# Patient Record
Sex: Female | Born: 1950 | Race: White | Hispanic: No | Marital: Married | Smoking: Never smoker
Health system: Southern US, Community
[De-identification: ages and names within clinical notes are randomized; demographics above are authoritative.]

## PROBLEM LIST (undated history)

## (undated) DIAGNOSIS — F329 Major depressive disorder, single episode, unspecified: Secondary | ICD-10-CM

## (undated) DIAGNOSIS — F32A Depression, unspecified: Secondary | ICD-10-CM

## (undated) DIAGNOSIS — F419 Anxiety disorder, unspecified: Secondary | ICD-10-CM

## (undated) DIAGNOSIS — I10 Essential (primary) hypertension: Secondary | ICD-10-CM

## (undated) DIAGNOSIS — C50919 Malignant neoplasm of unspecified site of unspecified female breast: Secondary | ICD-10-CM

## (undated) HISTORY — PX: APPENDECTOMY: SHX54

## (undated) HISTORY — PX: OOPHORECTOMY: SHX86

## (undated) HISTORY — PX: MASTECTOMY: SHX3

## (undated) HISTORY — PX: BREAST SURGERY: SHX581

---

## 2005-09-16 ENCOUNTER — Ambulatory Visit: Payer: Self-pay | Admitting: Oncology

## 2016-03-09 ENCOUNTER — Emergency Department (HOSPITAL_BASED_OUTPATIENT_CLINIC_OR_DEPARTMENT_OTHER)
Admission: EM | Admit: 2016-03-09 | Discharge: 2016-03-09 | Disposition: A | Discharge status: Home / Self Care | Payer: BLUE CROSS/BLUE SHIELD | Attending: Emergency Medicine | Admitting: Emergency Medicine

## 2016-03-09 ENCOUNTER — Encounter (HOSPITAL_BASED_OUTPATIENT_CLINIC_OR_DEPARTMENT_OTHER): Payer: Self-pay | Admitting: *Deleted

## 2016-03-09 ENCOUNTER — Emergency Department (HOSPITAL_BASED_OUTPATIENT_CLINIC_OR_DEPARTMENT_OTHER): Payer: BLUE CROSS/BLUE SHIELD

## 2016-03-09 DIAGNOSIS — S93401A Sprain of unspecified ligament of right ankle, initial encounter: Secondary | ICD-10-CM

## 2016-03-09 DIAGNOSIS — Z79899 Other long term (current) drug therapy: Secondary | ICD-10-CM | POA: Insufficient documentation

## 2016-03-09 DIAGNOSIS — F329 Major depressive disorder, single episode, unspecified: Secondary | ICD-10-CM | POA: Insufficient documentation

## 2016-03-09 DIAGNOSIS — Y939 Activity, unspecified: Secondary | ICD-10-CM | POA: Diagnosis not present

## 2016-03-09 DIAGNOSIS — I10 Essential (primary) hypertension: Secondary | ICD-10-CM | POA: Insufficient documentation

## 2016-03-09 DIAGNOSIS — Y929 Unspecified place or not applicable: Secondary | ICD-10-CM | POA: Diagnosis not present

## 2016-03-09 DIAGNOSIS — Z853 Personal history of malignant neoplasm of breast: Secondary | ICD-10-CM | POA: Diagnosis not present

## 2016-03-09 DIAGNOSIS — W19XXXA Unspecified fall, initial encounter: Secondary | ICD-10-CM | POA: Insufficient documentation

## 2016-03-09 DIAGNOSIS — Y999 Unspecified external cause status: Secondary | ICD-10-CM | POA: Insufficient documentation

## 2016-03-09 DIAGNOSIS — S99911A Unspecified injury of right ankle, initial encounter: Secondary | ICD-10-CM | POA: Diagnosis present

## 2016-03-09 HISTORY — DX: Anxiety disorder, unspecified: F41.9

## 2016-03-09 HISTORY — DX: Malignant neoplasm of unspecified site of unspecified female breast: C50.919

## 2016-03-09 HISTORY — DX: Essential (primary) hypertension: I10

## 2016-03-09 HISTORY — DX: Depression, unspecified: F32.A

## 2016-03-09 HISTORY — DX: Major depressive disorder, single episode, unspecified: F32.9

## 2016-03-09 MED ORDER — HYDROCODONE-ACETAMINOPHEN 5-325 MG PO TABS
1.0000 | ORAL_TABLET | Freq: Once | ORAL | Status: AC
  Administered 2016-03-09: 1 via ORAL
  Filled 2016-03-09: qty 1

## 2016-03-09 MED ORDER — NAPROXEN 500 MG PO TABS: 500.0000 mg | ORAL_TABLET | Freq: Two times a day (BID) | ORAL | 0 refills | Status: DC

## 2016-03-09 NOTE — ED Provider Notes (Signed)
[place] [facility] Provider Note   CSN: [id number] Arrival date & time: 03/09/16  [id number]  First Provider Contact:  First MD Initiated Contact with Patient 03/09/16 0109        History   Chief Complaint Chief Complaint  Patient presents with  . Ankle Injury    HPI Kristy Chambers is a 65 y.o. female.  HPI  This is a 65 year old female who presents with a right ankle injury. Patient reports that she was carrying her grandson early today when she her right ankle and fell. She denies hitting her head or loss of consciousness. She has been ambulatory and "thought I was doing better." However, she states that at approximately 9 PM she had increasing right ankle pain. She took Tylenol and ibuprofen with some relief. Current pain is 7 out of 10. She denies any numbness or tingling. She denies any other injury.  Past Medical History:  Diagnosis Date  . Anxiety   . Breast cancer ([place])   . Depression   . Hypertension     There are no active problems to display for this patient.   Past Surgical History:  Procedure Laterality Date  . APPENDECTOMY    . BREAST SURGERY    . MASTECTOMY    . OOPHORECTOMY      OB History    No data available       Home Medications    Prior to Admission medications   Medication Sig Start Date End Date Taking? Authorizing Provider  ALPRAZolam Duanne Moron) 0.5 MG tablet Take 0.5 mg by mouth at bedtime as needed for anxiety.   Yes Historical Provider, MD  FLUoxetine (PROZAC) 20 MG tablet Take 60 mg by mouth daily.   Yes Historical Provider, MD  metoprolol (LOPRESSOR) 50 MG tablet Take 50 mg by mouth 2 (two) times daily.   Yes Historical Provider, MD  olmesartan-hydrochlorothiazide (BENICAR HCT) 40-25 MG tablet Take 1 tablet by mouth daily.   Yes Historical Provider, MD  naproxen (NAPROSYN) 500 MG tablet Take 1 tablet (500 mg total) by mouth 2 (two) times daily. 03/09/16   Merryl Hacker, MD    Family History History reviewed. No pertinent family  history.  Social History Social History  Substance Use Topics  . Smoking status: Never Smoker  . Smokeless tobacco: Not on file  . Alcohol use No     Allergies   Codeine; Darvon [propoxyphene]; and Morphine and related   Review of Systems Review of Systems  Musculoskeletal:       Ankle pain  Skin: Negative for wound.  Neurological: Negative for weakness and numbness.  All other systems reviewed and are negative.    Physical Exam Updated Vital Signs BP 166/77 (BP Location: Left Arm)   Pulse (!) 56   Temp 98.1 F (36.7 C)   Resp 16   Ht 5\' 5"  (1.651 m)   Wt 146 lb (66.2 kg)   SpO2 97%   BMI 24.30 kg/m   Physical Exam  Constitutional: She is oriented to person, place, and time. She appears well-developed and well-nourished.  HENT:  Head: Normocephalic and atraumatic.  Cardiovascular: Normal rate, regular rhythm and normal heart sounds.   Pulmonary/Chest: Effort normal. No respiratory distress.  Musculoskeletal:  Focused examination of the right ankle reveals tenderness to palpation over the medial malleolus, no obvious deformities, no significant swelling, 2+ DP pulse, no proximal fibular tenderness  Neurological: She is alert and oriented to person, place, and time.  Skin: Skin is warm and  dry.  Psychiatric: She has a normal mood and affect.  Nursing note and vitals reviewed.    ED Treatments / Results  Labs (all labs ordered are listed, but only abnormal results are displayed) Labs Reviewed - No data to display  EKG  EKG Interpretation None       Radiology Dg Ankle Complete Right  Result Date: 03/09/2016 CLINICAL DATA:  65 year old female with fall and right ankle pain. EXAM: RIGHT ANKLE - COMPLETE 3+ VIEW COMPARISON:  None. FINDINGS: There is no evidence of fracture, dislocation, or joint effusion. There is no evidence of arthropathy or other focal bone abnormality. Soft tissues are unremarkable. IMPRESSION: Negative. Electronically Signed   By:  Anner Crete M.D.   On: 03/09/2016 01:26   Procedures Procedures (including critical care time)  Medications Ordered in ED Medications  HYDROcodone-acetaminophen (NORCO/VICODIN) 5-325 MG per tablet 1 tablet (1 tablet Oral Given 03/09/16 0129)     Initial Impression / Assessment and Plan / ED Course  I have reviewed the triage vital signs and the nursing notes.  Pertinent labs & imaging results that were available during my care of the patient were reviewed by me and considered in my medical decision making (see chart for details).  Clinical Course    Patient presents after injury to the right ankle. Has been ambulatory. Worsening pain. Nontoxic. No significant deformity or swelling. X-rays negative for fracture. Likely sprain. Discussed with patient rest, ice, compression, and elevation. Increasing pain likely related to her being ambulatory most of the day on an acute sprain. Naproxen twice a day for 3-5 days.  After history, exam, and medical workup I feel the patient has been appropriately medically screened and is safe for discharge home. Pertinent diagnoses were discussed with the patient. Patient was given return precautions.   Final Clinical Impressions(s) / ED Diagnoses   Final diagnoses:  Ankle sprain, right, initial encounter    New Prescriptions New Prescriptions   NAPROXEN (NAPROSYN) 500 MG TABLET    Take 1 tablet (500 mg total) by mouth 2 (two) times daily.     Merryl Hacker, MD 03/09/16 [phone]

## 2016-03-09 NOTE — ED Triage Notes (Signed)
Pt c/o fall with right ankle injury x 1 day ago

## 2018-02-25 IMAGING — DX DG ANKLE COMPLETE 3+V*R*
3 series · 3 of 3 positions shown · non-contrast
Comparison: None.

CLINICAL DATA: 65-year-old female with fall and right ankle pain.

EXAM:
RIGHT ANKLE - COMPLETE 3+ VIEW

[ankle ap]
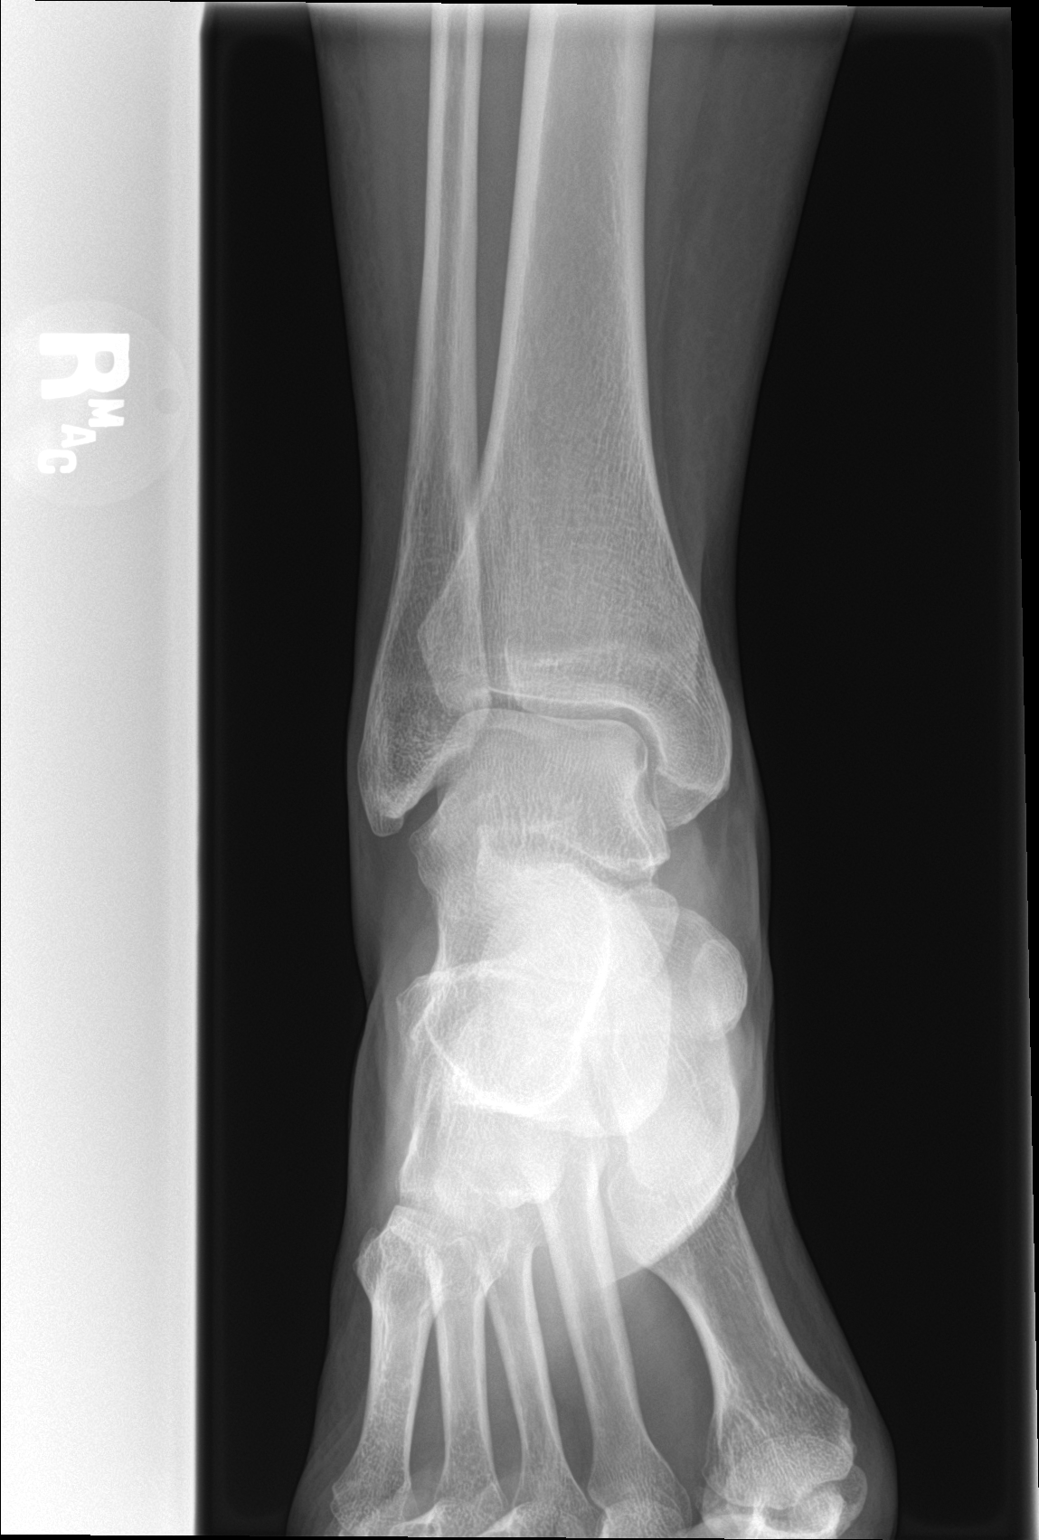

[ankle obl]
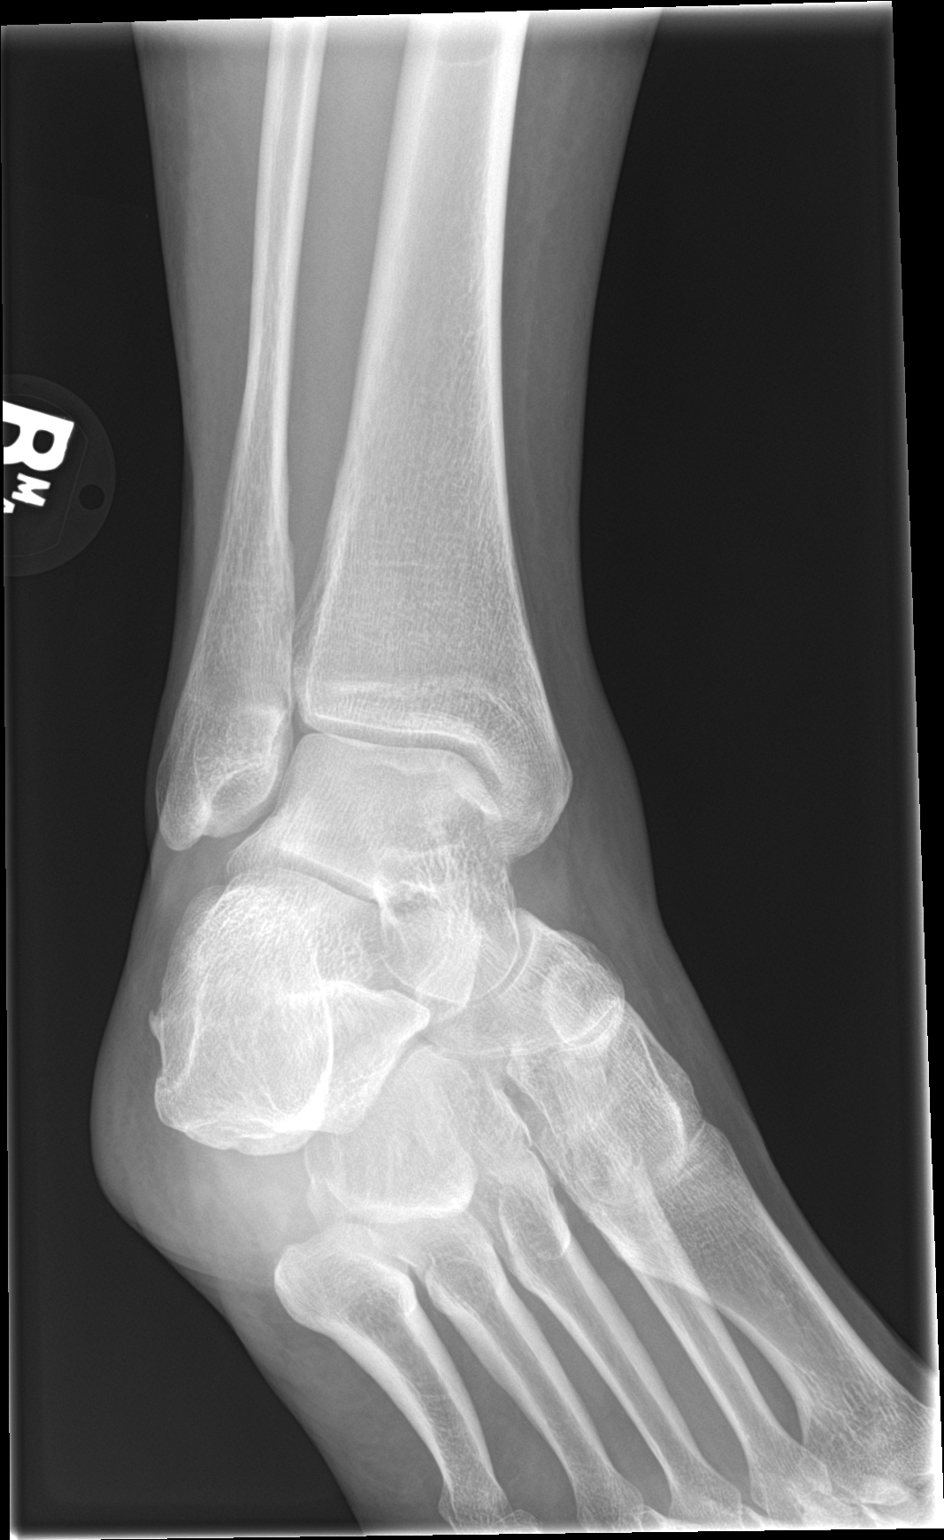

[ankle lat]
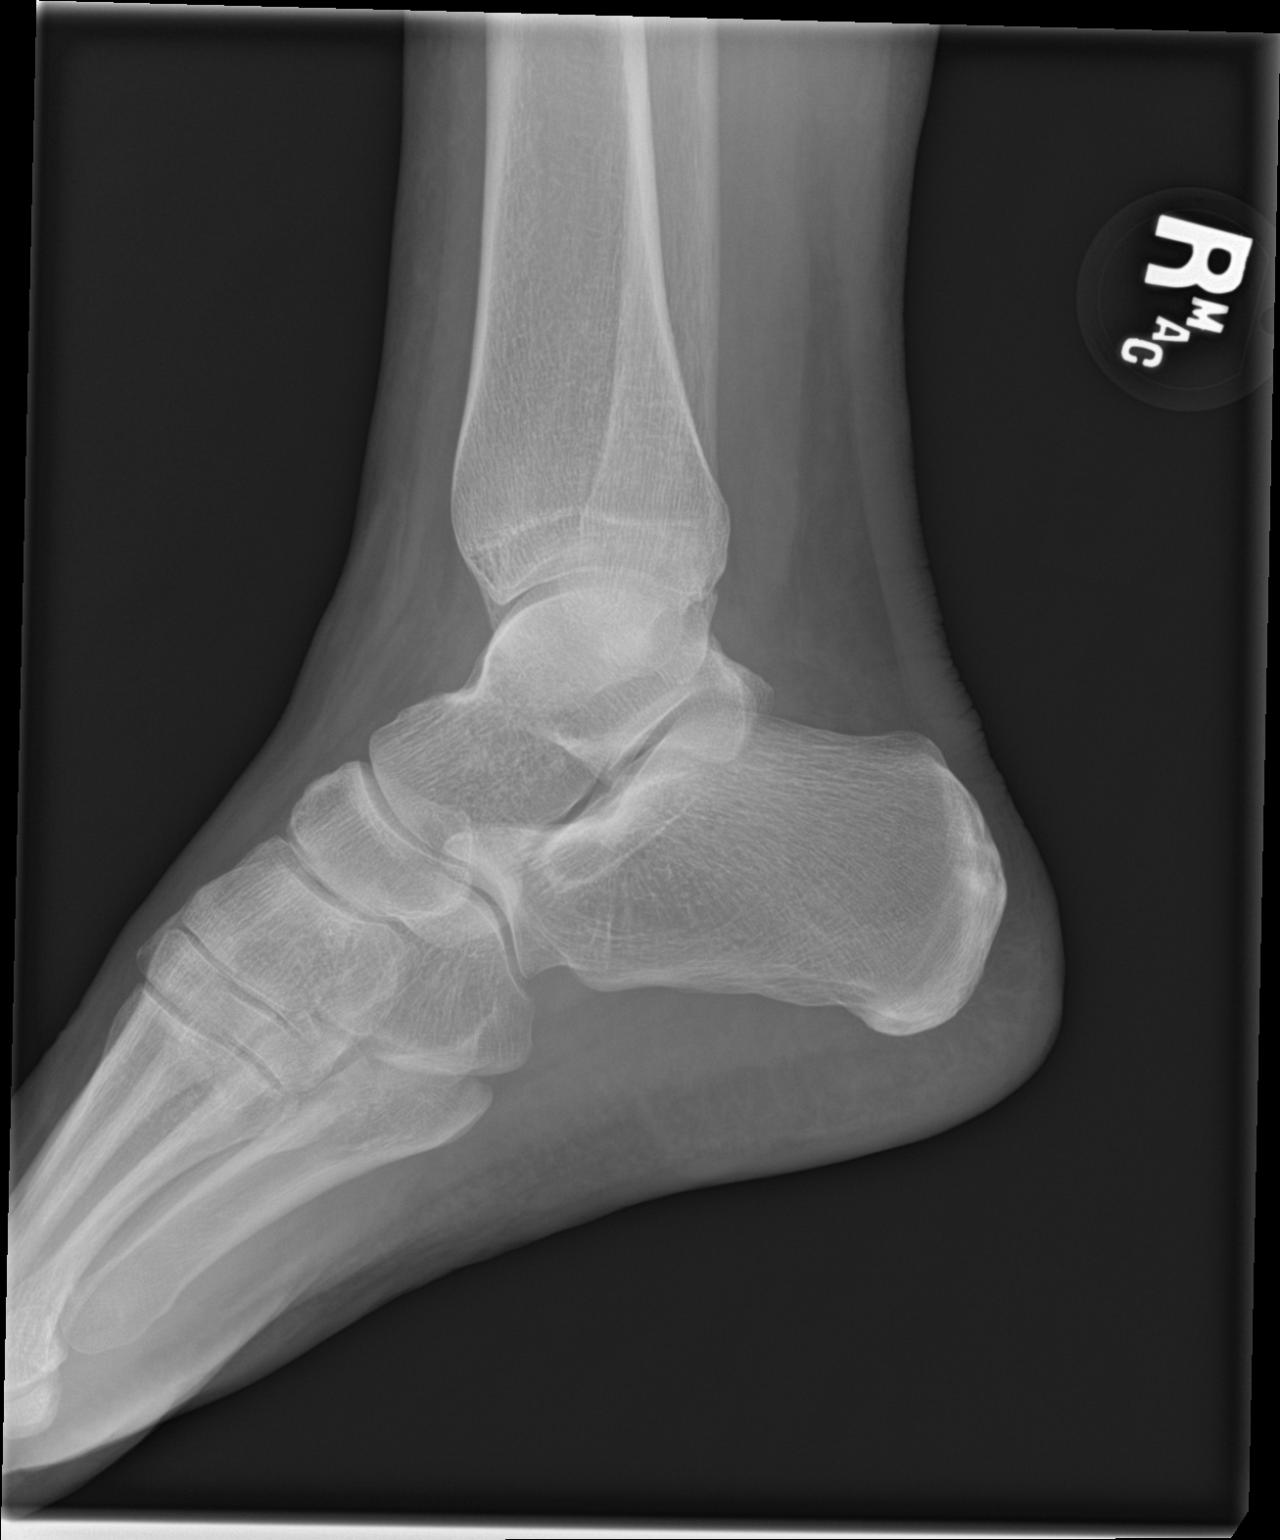

[3 of 3 positions shown; findings below may reference images not displayed]

FINDINGS: There is no evidence of fracture, dislocation, or joint effusion.
There is no evidence of arthropathy or other focal bone abnormality.
Soft tissues are unremarkable.
IMPRESSION: Negative.

## 2020-03-18 ENCOUNTER — Other Ambulatory Visit: Payer: Self-pay

## 2020-03-18 ENCOUNTER — Emergency Department (HOSPITAL_BASED_OUTPATIENT_CLINIC_OR_DEPARTMENT_OTHER)
Admission: EM | Admit: 2020-03-18 | Discharge: 2020-03-18 | Disposition: A | Discharge status: Home / Self Care | Payer: Medicare Other | Attending: Emergency Medicine | Admitting: Emergency Medicine

## 2020-03-18 ENCOUNTER — Encounter (HOSPITAL_BASED_OUTPATIENT_CLINIC_OR_DEPARTMENT_OTHER): Payer: Self-pay

## 2020-03-18 ENCOUNTER — Emergency Department (HOSPITAL_BASED_OUTPATIENT_CLINIC_OR_DEPARTMENT_OTHER): Payer: Medicare Other

## 2020-03-18 DIAGNOSIS — R6884 Jaw pain: Secondary | ICD-10-CM | POA: Diagnosis present

## 2020-03-18 DIAGNOSIS — Z5321 Procedure and treatment not carried out due to patient leaving prior to being seen by health care provider: Secondary | ICD-10-CM | POA: Diagnosis not present

## 2020-03-18 LAB — CBC
HCT: 42.4 % (ref 36.0–46.0)
Hemoglobin: 13.9 g/dL (ref 12.0–15.0)
MCH: 28.7 pg (ref 26.0–34.0)
MCHC: 32.8 g/dL (ref 30.0–36.0)
MCV: 87.6 fL (ref 80.0–100.0)
Platelets: 247 10*3/uL (ref 150–400)
RBC: 4.84 MIL/uL (ref 3.87–5.11)
RDW: 13.4 % (ref 11.5–15.5)
WBC: 5.7 10*3/uL (ref 4.0–10.5)
nRBC: 0 % (ref 0.0–0.2)

## 2020-03-18 LAB — BASIC METABOLIC PANEL
Anion gap: 12 (ref 5–15)
BUN: 14 mg/dL (ref 8–23)
CO2: 27 mmol/L (ref 22–32)
Calcium: 8.9 mg/dL (ref 8.9–10.3)
Chloride: 94 mmol/L — ABNORMAL LOW (ref 98–111)
Creatinine, Ser: 0.78 mg/dL (ref 0.44–1.00)
GFR calc Af Amer: >60 mL/min (ref >60)
GFR calc non Af Amer: >60 mL/min (ref >60)
Glucose, Bld: 172 mg/dL — ABNORMAL HIGH (ref 70–99)
Potassium: 3.6 mmol/L (ref 3.5–5.1)
Sodium: 133 mmol/L — ABNORMAL LOW (ref 135–145)

## 2020-03-18 LAB — TROPONIN I (HIGH SENSITIVITY): Troponin I (High Sensitivity): 3 ng/L (ref <18)

## 2020-03-18 MED ORDER — SODIUM CHLORIDE 0.9% FLUSH
3.0000 mL | Freq: Once | INTRAVENOUS | Status: DC
  Filled 2020-03-18: qty 3

## 2020-03-18 NOTE — ED Triage Notes (Addendum)
Pt c/o left side jaw pain x 2 days-denies injury to site-elevated BP x today-states "I feel like I'm having a heart attack"-pr denies hx of MI-also states she took xanax for anxiety PTA-NAD-steady gait

## 2020-03-18 NOTE — ED Notes (Signed)
Called pt   No response from lobby or outside

## 2022-03-06 IMAGING — CR DG CHEST 2V
2 series · 2 of 2 positions shown · non-contrast
Comparison: None.

CLINICAL DATA: Left-sided jaw pain x2 days.

EXAM:
CHEST - 2 VIEW

[w chest pa]
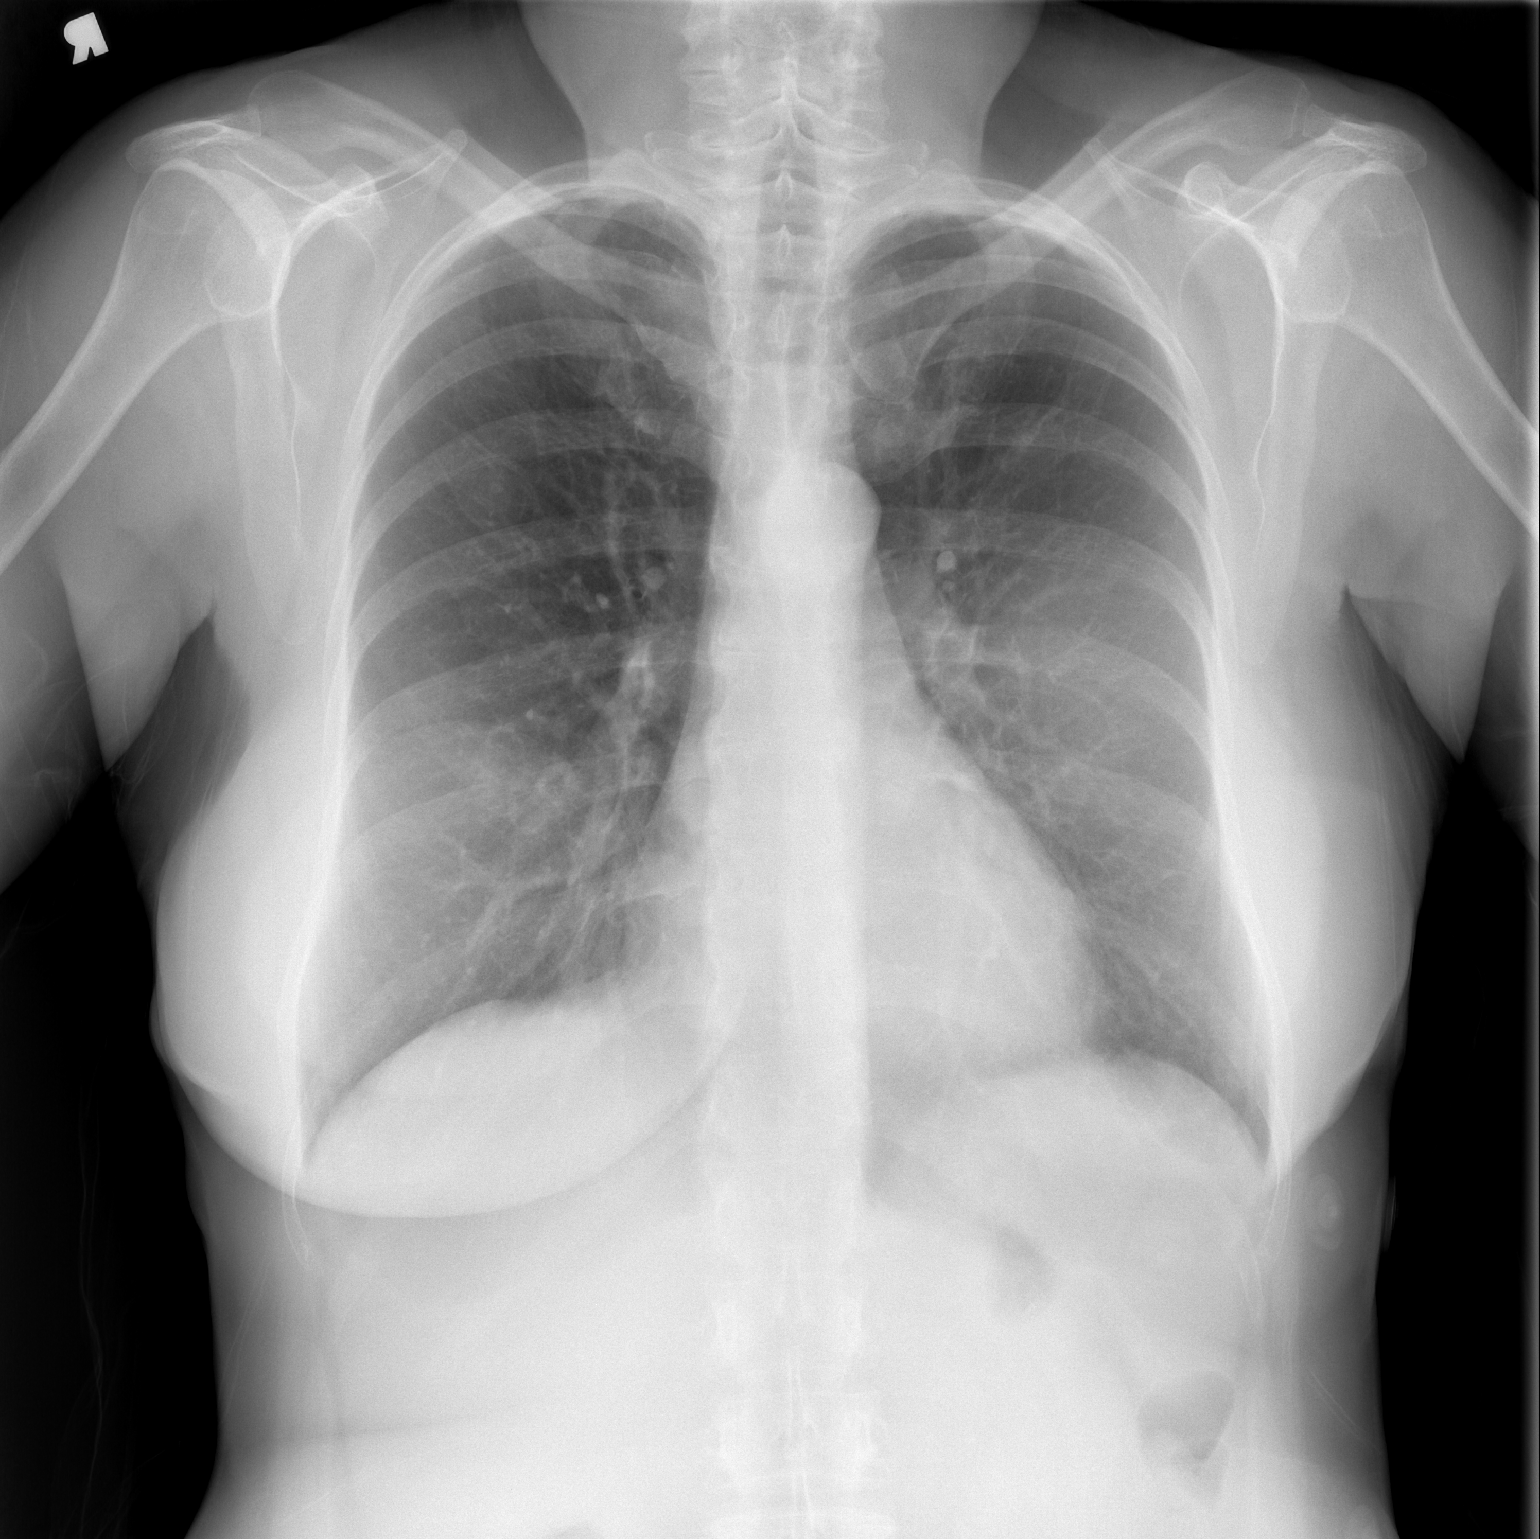

[w chest lat]
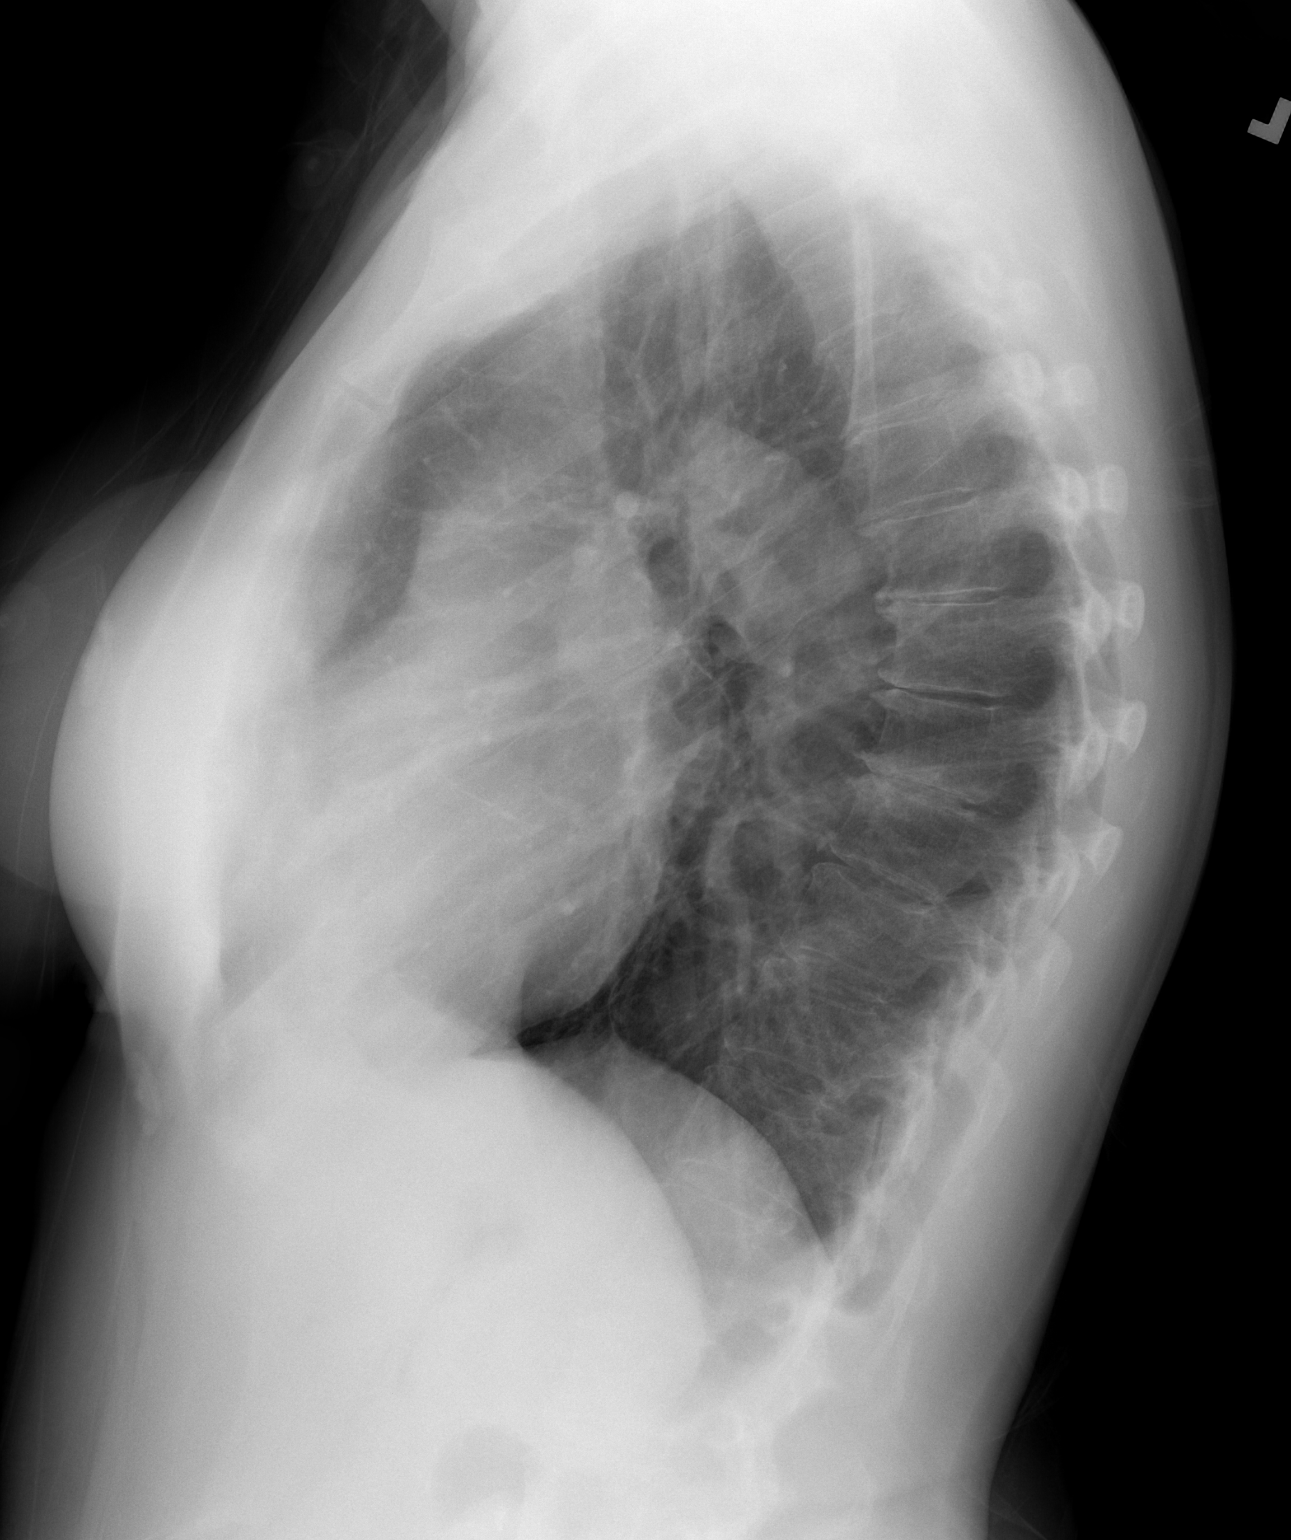

[2 of 2 positions shown; findings below may reference images not displayed]

FINDINGS: There is no evidence of acute infiltrate, pleural effusion or
pneumothorax. The heart size and mediastinal contours are within
normal limits. The visualized skeletal structures are unremarkable.
IMPRESSION: No active cardiopulmonary disease.

## 2024-02-06 ENCOUNTER — Other Ambulatory Visit: Payer: Self-pay

## 2024-02-06 ENCOUNTER — Emergency Department (HOSPITAL_COMMUNITY)
Admission: EM | Admit: 2024-02-06 | Discharge: 2024-02-06 | Discharge status: Against Medical Advice | Attending: Emergency Medicine | Admitting: Emergency Medicine

## 2024-02-06 ENCOUNTER — Encounter (HOSPITAL_COMMUNITY): Payer: Self-pay | Admitting: Emergency Medicine

## 2024-02-06 DIAGNOSIS — H9201 Otalgia, right ear: Secondary | ICD-10-CM | POA: Diagnosis present

## 2024-02-06 DIAGNOSIS — I10 Essential (primary) hypertension: Secondary | ICD-10-CM | POA: Diagnosis not present

## 2024-02-06 DIAGNOSIS — Z5321 Procedure and treatment not carried out due to patient leaving prior to being seen by health care provider: Secondary | ICD-10-CM | POA: Diagnosis not present

## 2024-02-06 NOTE — ED Triage Notes (Signed)
 Pt to ED via [facility] from home c/o sharp pain behind right ear that woke her suddenly from sleep around 2000 tonight.  Pain non radiating, denies visual changes.  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  Has not taken full dose HTN meds since Sunday.  EMS vitals 194/85 BP, 65 HR, 98% RA, 82 CBG and given liquid tylenol  en route.

## 2024-02-06 NOTE — ED Notes (Signed)
 Pt waiting for EDP to do MSE.

## 2024-02-06 NOTE — ED Notes (Signed)
 Pt stated that she was leaving due her waiting to long. She stated that she was EMS Triage 13.

## 2024-02-06 NOTE — ED Notes (Signed)
 Pt standing at door getting antsy and wanting update.  Pt informed that she is still waiting to have MSE done.  EDPs requested several different times for MSE.

## 2024-03-25 ENCOUNTER — Encounter (HOSPITAL_BASED_OUTPATIENT_CLINIC_OR_DEPARTMENT_OTHER): Payer: Self-pay | Admitting: Emergency Medicine

## 2024-03-25 ENCOUNTER — Emergency Department (HOSPITAL_BASED_OUTPATIENT_CLINIC_OR_DEPARTMENT_OTHER)

## 2024-03-25 ENCOUNTER — Emergency Department (HOSPITAL_BASED_OUTPATIENT_CLINIC_OR_DEPARTMENT_OTHER)
Admission: EM | Admit: 2024-03-25 | Discharge: 2024-03-25 | Disposition: A | Discharge status: Home / Self Care | Attending: Emergency Medicine | Admitting: Emergency Medicine

## 2024-03-25 ENCOUNTER — Other Ambulatory Visit: Payer: Self-pay

## 2024-03-25 DIAGNOSIS — W010XXA Fall on same level from slipping, tripping and stumbling without subsequent striking against object, initial encounter: Secondary | ICD-10-CM | POA: Insufficient documentation

## 2024-03-25 DIAGNOSIS — M79632 Pain in left forearm: Secondary | ICD-10-CM | POA: Diagnosis present

## 2024-03-25 DIAGNOSIS — M79602 Pain in left arm: Secondary | ICD-10-CM

## 2024-03-25 DIAGNOSIS — W19XXXA Unspecified fall, initial encounter: Secondary | ICD-10-CM

## 2024-03-25 MED ORDER — OXYCODONE HCL 5 MG PO TABS
5.0000 mg | ORAL_TABLET | Freq: Once | ORAL | Status: AC
  Administered 2024-03-25 (×2): 5 mg via ORAL
  Filled 2024-03-25: qty 1

## 2024-03-25 MED ORDER — NAPROXEN 500 MG PO TABS
500.0000 mg | ORAL_TABLET | Freq: Two times a day (BID) | ORAL | 0 refills | Status: AC
Start: 2024-03-25 — End: ?

## 2024-03-25 MED ORDER — LIDOCAINE 5 % EX PTCH: 1.0000 | MEDICATED_PATCH | CUTANEOUS | 0 refills | Status: AC

## 2024-03-25 NOTE — ED Provider Notes (Signed)
 [facility] Provider Note   CSN: [id number] Arrival date & time: 03/25/24  1832    Patient presents with: Kristy Chambers is a 73 y.o. female here for eval mechanical fall.  Slipped and fell earlier today.  She has pain to her mid and distal forearm on her left hand.  She is right-hand dominant.  No numbness or weakness.  She does not think she hit her head.  No LOC, anticoagulation.  Was ambulatory after the fall.  No back pain, pelvic pain, lower extremity pain.  She has some pain to her proximal humerus with overhead range of motion.  Took Tylenol  at home.   HPI     Prior to Admission medications   Medication Sig Start Date End Date Taking? Authorizing Provider  lidocaine  (LIDODERM ) 5 % Place 1 patch onto the skin daily. Remove & Discard patch within 12 hours or as directed by MD 03/25/24  Yes Mariaelena Cade A, PA-C  ALPRAZolam (XANAX) 0.5 MG tablet Take 0.5 mg by mouth at bedtime as needed for anxiety.    [provider]  FLUoxetine (PROZAC) 20 MG tablet Take 60 mg by mouth daily.    [provider]  metoprolol (LOPRESSOR) 50 MG tablet Take 50 mg by mouth 2 (two) times daily.    [provider]  naproxen  (NAPROSYN ) 500 MG tablet Take 1 tablet (500 mg total) by mouth 2 (two) times daily. 03/25/24   Sendy Pluta A, PA-C  olmesartan-hydrochlorothiazide (BENICAR HCT) 40-25 MG tablet Take 1 tablet by mouth daily.    [provider]    Allergies: Codeine, Darvon [propoxyphene], Morphine and codeine, and Shellfish allergy    Review of Systems  Constitutional: Negative.   HENT: Negative.    Respiratory: Negative.    Cardiovascular: Negative.   Gastrointestinal: Negative.   Genitourinary: Negative.   Musculoskeletal:  Negative for neck pain and neck stiffness.       Left arm pain  Skin: Negative.   Neurological: Negative.   All other systems reviewed and are negative.   Updated Vital  Signs BP (!) 154/93   Pulse 77   Temp 98.1 F (36.7 C) (Oral)   Resp 15   Ht 5' 4 (1.626 m)   Wt 63.5 kg   SpO2 99%   BMI 24.03 kg/m   Physical Exam Vitals and nursing note reviewed.  Constitutional:      General: She is not in acute distress.    Appearance: She is well-developed. She is not ill-appearing, toxic-appearing or diaphoretic.  HENT:     Head: Normocephalic and atraumatic.  Eyes:     Pupils: Pupils are equal, round, and reactive to light.  Neck:     Comments: No midline cervical tenderness, full range of motion Cardiovascular:     Rate and Rhythm: Normal rate and regular rhythm.     Pulses: Normal pulses.          Radial pulses are 2+ on the right side and 2+ on the left side.     Heart sounds: Normal heart sounds.  Pulmonary:     Effort: Pulmonary effort is normal. No respiratory distress.     Breath sounds: Normal breath sounds.  Abdominal:     General: Bowel sounds are normal. There is no distension.     Palpations: Abdomen is soft.  Musculoskeletal:        General: Tenderness and signs of injury present. No swelling or deformity.  Normal range of motion.     Cervical back: Normal range of motion and neck supple.     Right lower leg: No edema.     Left lower leg: No edema.     Comments: No midline C/T/L tenderness Nontender bilateral lower extremity, full range of motion Nontender right upper extremity, full range of motion Tenderness right mid distal forearm, able to pronate, supinate without difficulty nontender scaphoid, hand Mild tenderness mid humerus, pain with range of motion overhead.  Nontender clavicle or scapula.  Skin:    General: Skin is warm and dry.     Capillary Refill: Capillary refill takes less than 2 seconds.     Comments: No obvious contusion, abrasions.  Compartments are soft.  No lacerations.  No ecchymosis  Neurological:     General: No focal deficit present.     Mental Status: She is alert and oriented to person, place, and  time.     Comments: Ambulatory, equal strength, intact sensation     (all labs ordered are listed, but only abnormal results are displayed) Labs Reviewed - No data to display  EKG: None  Radiology: DG Forearm Left Result Date: 03/25/2024 CLINICAL DATA:  Status post fall. EXAM: LEFT FOREARM - 2 VIEW COMPARISON:  None Available. FINDINGS: There is no evidence of fracture or other focal bone lesions. Soft tissues are unremarkable. IMPRESSION: Negative. Electronically Signed   By: Suzen Dials M.D.   On: 03/25/2024 20:45     .Splint Application  Date/Time: 03/25/2024 9:08 PM  Performed by: Edie Einstein A, PA-C Authorized by: Edie Einstein LABOR, PA-C   Consent:    Consent obtained:  Verbal   Consent given by:  Patient, parent, spouse, guardian and healthcare agent   Risks, benefits, and alternatives were discussed: yes     Risks discussed:  Discoloration, numbness, pain and swelling   Alternatives discussed:  No treatment, delayed treatment, alternative treatment and observation Universal protocol:    Procedure explained and questions answered to patient or proxy's satisfaction: yes     Relevant documents present and verified: yes     Test results available: yes     Imaging studies available: yes     Required blood products, implants, devices, and special equipment available: yes     Site/side marked: yes     Immediately prior to procedure a time out was called: yes     Patient identity confirmed:  Verbally with patient Pre-procedure details:    Distal neurologic exam:  Normal   Distal perfusion: distal pulses strong and brisk capillary refill   Post-procedure details:    Distal neurologic exam:  Normal   Distal perfusion: distal pulses strong and brisk capillary refill     Procedure completion:  Tolerated well, no immediate complications   Post-procedure imaging: not applicable      Medications Ordered in the ED  oxyCODONE  (Oxy IR/ROXICODONE ) immediate release  tablet 5 mg (has no administration in time range)   73 year old here for eval mechanical fall.  Slipped and fell earlier today.  No head injury, LOC or anticoagulation.  No midline C/T/L tenderness.  Neurovascular intact.  She has pain to her left mid forearm, pain with range of motion to her left shoulder overhead as well as left mid and distal forearm pain.  Nontender at scaphoid.  Mild soft tissue swelling distal forearm however no obvious step-off.  X-ray left forearm was obtained from triage.  I had recommended additional imaging however patient declined. She voices understanding  of the risk of missed diagnosis which could be life or limb threatening.  Imaging personally viewed and interpreted:  X-ray left forearm shows no fracture or dislocation  Given swelling, pain she was placed in a Velcro volar wrist splint.  Will have her follow-up with orthopedics.  Discussed RICE for symptomatic management.  Compartments are soft, low suspicion for compartment syndrome.  No open skin to suggest open fracture, no lacerations needing closure.  I encouraged her to follow-up outpatient, return for any worsening symptoms  The patient has been appropriately medically screened and/or stabilized in the ED. I have low suspicion for any other emergent medical condition which would require further screening, evaluation or treatment in the ED or require inpatient management.  Patient is hemodynamically stable and in no acute distress.  Patient able to ambulate in department prior to ED.  Evaluation does not show acute pathology that would require ongoing or additional emergent interventions while in the emergency department or further inpatient treatment.  I have discussed the diagnosis with the patient and answered all questions.  Pain is been managed while in the emergency department and patient has no further complaints prior to discharge.  Patient is comfortable with plan discussed in room and is stable for  discharge at this time.  I have discussed strict return precautions for returning to the emergency department.  Patient was encouraged to follow-up with PCP/specialist refer to at discharge.                                    Medical Decision Making Amount and/or Complexity of Data Reviewed Independent Historian: spouse External Data Reviewed: labs, radiology and notes. Radiology: ordered and independent interpretation performed. Decision-making details documented in ED Course.  Risk OTC drugs. Prescription drug management. Decision regarding hospitalization. Diagnosis or treatment significantly limited by social determinants of health.       Final diagnoses:  Fall, initial encounter  Left arm pain    ED Discharge Orders          Ordered    naproxen  (NAPROSYN ) 500 MG tablet  2 times daily        03/25/24 2108    lidocaine  (LIDODERM ) 5 %  Every 24 hours        03/25/24 2108               Arbor Leer A, PA-C 03/25/24 2111    Elnor Savant A, DO 03/27/24 1614

## 2024-03-25 NOTE — ED Notes (Signed)
 Discharge instructions reviewed.   Newly prescribed medications discussed. Pharmacy verified.   Opportunity for questions and concerns provided.   Alert, oriented and ambulatory.   Displays no signs of distress.   Declined discharge vitals. Encouraged to follow up with emergortho for ongoing symptoms.

## 2024-03-25 NOTE — ED Triage Notes (Signed)
 Pt reports mechanical fall today.  C/o L forearm and wrist pain. Reports she did hit her head when she fell, denies blood thinners.

## 2024-03-25 NOTE — Discharge Instructions (Signed)
 It was a pleasure taking care of you here today  Your x-ray of your forearm did not show any significant abnormality.  You did endorse some pain to your left shoulder.  I had recommended x-ray which you declined.  We placed you in a splint for your forearm pain.  I have written you for a few medications to help with your symptoms  Make sure to follow-up with orthopedics  Return for any worsening symptoms
